# Patient Record
Sex: Male | Born: 1965
Health system: Southern US, Community
[De-identification: ages and names within clinical notes are randomized; demographics above are authoritative.]

## PROBLEM LIST (undated history)

## (undated) DIAGNOSIS — E119 Type 2 diabetes mellitus without complications: Secondary | ICD-10-CM

---

## 2017-10-19 DIAGNOSIS — Z79899 Other long term (current) drug therapy: Secondary | ICD-10-CM | POA: Diagnosis not present

## 2017-10-19 DIAGNOSIS — E1129 Type 2 diabetes mellitus with other diabetic kidney complication: Secondary | ICD-10-CM | POA: Diagnosis not present

## 2017-10-19 DIAGNOSIS — R809 Proteinuria, unspecified: Secondary | ICD-10-CM | POA: Diagnosis not present

## 2017-10-30 DIAGNOSIS — J019 Acute sinusitis, unspecified: Secondary | ICD-10-CM | POA: Diagnosis not present

## 2018-09-24 DIAGNOSIS — E119 Type 2 diabetes mellitus without complications: Secondary | ICD-10-CM | POA: Diagnosis not present

## 2018-09-24 DIAGNOSIS — R7989 Other specified abnormal findings of blood chemistry: Secondary | ICD-10-CM | POA: Diagnosis not present

## 2018-09-24 DIAGNOSIS — Z125 Encounter for screening for malignant neoplasm of prostate: Secondary | ICD-10-CM | POA: Diagnosis not present

## 2018-09-24 DIAGNOSIS — M5126 Other intervertebral disc displacement, lumbar region: Secondary | ICD-10-CM | POA: Diagnosis not present

## 2018-10-02 DIAGNOSIS — Z0000 Encounter for general adult medical examination without abnormal findings: Secondary | ICD-10-CM | POA: Diagnosis not present

## 2018-10-31 ENCOUNTER — Ambulatory Visit (HOSPITAL_COMMUNITY)
Admission: EM | Admit: 2018-10-31 | Discharge: 2018-10-31 | Disposition: A | Discharge status: Home / Self Care | Payer: BLUE CROSS/BLUE SHIELD | Attending: Family Medicine | Admitting: Family Medicine

## 2018-10-31 ENCOUNTER — Encounter (HOSPITAL_COMMUNITY): Payer: Self-pay | Admitting: Emergency Medicine

## 2018-10-31 ENCOUNTER — Other Ambulatory Visit: Payer: Self-pay

## 2018-10-31 DIAGNOSIS — S39012A Strain of muscle, fascia and tendon of lower back, initial encounter: Secondary | ICD-10-CM | POA: Diagnosis not present

## 2018-10-31 HISTORY — DX: Type 2 diabetes mellitus without complications: E11.9

## 2018-10-31 MED ORDER — KETOROLAC TROMETHAMINE 60 MG/2ML IM SOLN
60.0000 mg | Freq: Once | INTRAMUSCULAR | Status: AC
  Administered 2018-10-31: 60 mg via INTRAMUSCULAR

## 2018-10-31 MED ORDER — KETOROLAC TROMETHAMINE 60 MG/2ML IM SOLN
INTRAMUSCULAR | Status: AC
  Filled 2018-10-31: qty 2

## 2018-10-31 MED ORDER — METHOCARBAMOL 750 MG PO TABS: 750.0000 mg | ORAL_TABLET | Freq: Three times a day (TID) | ORAL | 0 refills | Status: DC

## 2018-10-31 MED ORDER — IBUPROFEN 600 MG PO TABS: 600.0000 mg | ORAL_TABLET | Freq: Four times a day (QID) | ORAL | 0 refills | Status: DC | PRN

## 2018-10-31 NOTE — Discharge Instructions (Addendum)
We gave you a shot of Toradol today Use anti-inflammatories for pain/swelling. You may take up to 800 mg Ibuprofen every 8 hours with food. You may supplement Ibuprofen with Tylenol [phone] mg every 8 hours.  You may use Robaxin-muscle relaxer- 3 times daily, this may cause drowsiness, please limit use to at home or bedtime Alternate ice and heat Avoid heavy lifting, avoid complete bedrest-please go about daily activities to work tension out of muscles  Follow-up if pain worsening, developing numbness or tingling, weakness in legs, changes in urination or bowel movements, persistent symptoms.

## 2018-10-31 NOTE — ED Triage Notes (Signed)
Patient has back issues, "bulging disc since my 20's".  Last night, patient got out of his car and pain hit him.  Pain is lower left

## 2018-10-31 NOTE — ED Provider Notes (Signed)
[facility]    CSN: [id number] Arrival date & time: 10/31/18  1914     History   Chief Complaint Chief Complaint  Patient presents with  . Back Pain    HPI John Decker is a 52 y.o. male history of DM type II presenting today for evaluation of left lower back pain.  Patient states that after work yesterday, he was getting out of his car and felt a pull in his left lower back.  Since he has had persistent pain, pain worsening with movements.  Patient states that he was diagnosed with a small herniated disc approximately 30 years ago, but has not had persistent issues with this.  He went to physical therapy and a chiropractor and learned how to stretch and help with this pain.  He has never had pain in his back like this.  Denies any saddle anesthesia, numbness or tingling.  Denies changes in urination or bowel habits.  Denies weakness in legs.  He tried taking ibuprofen yesterday but symptoms persisting today.  Denies urinary symptoms of dysuria or increased frequency.  HPI  Past Medical History:  Diagnosis Date  . Diabetes mellitus without complication (HCC)     There are no active problems to display for this patient.   History reviewed. No pertinent surgical history.     Home Medications    Prior to Admission medications   Medication Sig Start Date End Date Taking? Authorizing Provider  losartan (COZAAR) 25 MG tablet Take 25 mg by mouth daily.   Yes [provider]  metFORMIN (GLUCOPHAGE) 500 MG tablet Take by mouth 2 (two) times daily with a meal.   Yes [provider]  ibuprofen (ADVIL,MOTRIN) 600 MG tablet Take 1 tablet (600 mg total) by mouth every 6 (six) hours as needed. 10/31/18   Silas Sedam C, PA-C  methocarbamol (ROBAXIN) 750 MG tablet Take 1 tablet (750 mg total) by mouth 3 (three) times daily. 10/31/18   Nikkole Placzek, Junius Creamer, PA-C    Family History History reviewed. No pertinent family history.  Social History Social  History   Tobacco Use  . Smoking status: Never Smoker  Substance Use Topics  . Alcohol use: Never    Frequency: Never  . Drug use: Never     Allergies   Patient has no known allergies.   Review of Systems Review of Systems  Constitutional: Negative for fatigue and fever.  HENT: Negative for congestion, sinus pressure and sore throat.   Eyes: Negative for photophobia, pain and visual disturbance.  Respiratory: Negative for cough and shortness of breath.   Cardiovascular: Negative for chest pain.  Gastrointestinal: Negative for abdominal pain, nausea and vomiting.  Genitourinary: Negative for decreased urine volume, difficulty urinating and hematuria.  Musculoskeletal: Positive for back pain, gait problem and myalgias. Negative for neck pain and neck stiffness.  Skin: Negative for color change.  Neurological: Negative for dizziness, syncope, facial asymmetry, speech difficulty, weakness, light-headedness, numbness and headaches.     Physical Exam Triage Vital Signs ED Triage Vitals [10/31/18 0823]  Enc Vitals Group     BP      Pulse      Resp      Temp      Temp src      SpO2      Weight      Height      Head Circumference      Peak Flow      Pain Score 8  Pain Loc      Pain Edu?      Excl. in GC?    No data found.  Updated Vital Signs BP 129/65 (BP Location: Left Arm)   Pulse 71   Temp 97.7 F (36.5 C) (Oral)   Resp 18   SpO2 100%   Visual Acuity Right Eye Distance:   Left Eye Distance:   Bilateral Distance:    Right Eye Near:   Left Eye Near:    Bilateral Near:     Physical Exam  Constitutional: He appears well-developed and well-nourished.  HENT:  Head: Normocephalic and atraumatic.  Eyes: Conjunctivae are normal.  Neck: Neck supple.  Cardiovascular: Normal rate and regular rhythm.  No murmur heard. Pulmonary/Chest: Effort normal and breath sounds normal. No respiratory distress.  Abdominal: Soft. There is no tenderness.    Musculoskeletal: He exhibits no edema.  Nontender to palpation of cervical and thoracic spine midline, mild tenderness to palpation of lumbar spine midline throughout upper lumbar area, significant increased tenderness to palpation of left paraspinal lumbar musculature extending into more lateral lumbar area tenderness does not extend into gluteal area, negative straight leg raise bilaterally  Strength 5/5 and equal bilaterally at hips and knees, patellar reflex 2+  Neurological: He is alert.  Skin: Skin is warm and dry.  Psychiatric: He has a normal mood and affect.  Nursing note and vitals reviewed.    UC Treatments / Results  Labs (all labs ordered are listed, but only abnormal results are displayed) Labs Reviewed - No data to display  EKG None  Radiology No results found.  Procedures Procedures (including critical care time)  Medications Ordered in UC Medications  ketorolac (TORADOL) injection 60 mg (60 mg Intramuscular Given 10/31/18 0910)    Initial Impression / Assessment and Plan / UC Course  I have reviewed the triage vital signs and the nursing notes.  Pertinent labs & imaging results that were available during my care of the patient were reviewed by me and considered in my medical decision making (see chart for details).   No red flags for cauda equina Likely lumbar strain versus lumbar radiculopathy, will treat with anti-inflammatories and muscle relaxers.  Discussed prednisone versus NSAIDs.  Opted for NSAIDs.  Also provide Robaxin in order to minimize sedation.  Ice and heat.  Activity modification, but avoid bedrest.  Toradol prior to discharge.Discussed strict return precautions. Patient verbalized understanding and is agreeable with plan.   Final Clinical Impressions(s) / UC Diagnoses   Final diagnoses:  Strain of lumbar region, initial encounter     Discharge Instructions     We gave you a shot of Toradol today Use anti-inflammatories for  pain/swelling. You may take up to 800 mg Ibuprofen every 8 hours with food. You may supplement Ibuprofen with Tylenol [phone] mg every 8 hours.  You may use Robaxin-muscle relaxer- 3 times daily, this may cause drowsiness, please limit use to at home or bedtime Alternate ice and heat Avoid heavy lifting, avoid complete bedrest-please go about daily activities to work tension out of muscles  Follow-up if pain worsening, developing numbness or tingling, weakness in legs, changes in urination or bowel movements, persistent symptoms.    ED Prescriptions    Medication Sig Dispense Auth. Provider   ibuprofen (ADVIL,MOTRIN) 600 MG tablet Take 1 tablet (600 mg total) by mouth every 6 (six) hours as needed. 30 tablet Quentez Lober C, PA-C   methocarbamol (ROBAXIN) 750 MG tablet Take 1 tablet (750 mg total) by  mouth 3 (three) times daily. 24 tablet Kaz Auld, [place] C, PA-C     Controlled Substance Prescriptions [place] Controlled Substance Registry consulted? Not Applicable   Lew Dawes, [place] 10/31/18 1610

## 2019-07-29 DIAGNOSIS — E119 Type 2 diabetes mellitus without complications: Secondary | ICD-10-CM | POA: Diagnosis not present

## 2019-07-29 DIAGNOSIS — R7989 Other specified abnormal findings of blood chemistry: Secondary | ICD-10-CM | POA: Diagnosis not present

## 2019-08-05 DIAGNOSIS — Z7189 Other specified counseling: Secondary | ICD-10-CM | POA: Diagnosis not present

## 2019-08-05 DIAGNOSIS — E119 Type 2 diabetes mellitus without complications: Secondary | ICD-10-CM | POA: Diagnosis not present

## 2019-08-05 DIAGNOSIS — Z0000 Encounter for general adult medical examination without abnormal findings: Secondary | ICD-10-CM | POA: Diagnosis not present

## 2019-08-05 DIAGNOSIS — R7989 Other specified abnormal findings of blood chemistry: Secondary | ICD-10-CM | POA: Diagnosis not present

## 2019-10-13 ENCOUNTER — Ambulatory Visit (HOSPITAL_COMMUNITY)
Admission: EM | Admit: 2019-10-13 | Discharge: 2019-10-13 | Disposition: A | Discharge status: Home / Self Care | Payer: BC Managed Care – PPO | Attending: Urgent Care | Admitting: Urgent Care

## 2019-10-13 ENCOUNTER — Other Ambulatory Visit: Payer: Self-pay

## 2019-10-13 ENCOUNTER — Telehealth: Payer: BLUE CROSS/BLUE SHIELD | Admitting: Family

## 2019-10-13 ENCOUNTER — Encounter (HOSPITAL_COMMUNITY): Payer: Self-pay

## 2019-10-13 DIAGNOSIS — Z79899 Other long term (current) drug therapy: Secondary | ICD-10-CM | POA: Insufficient documentation

## 2019-10-13 DIAGNOSIS — Z7984 Long term (current) use of oral hypoglycemic drugs: Secondary | ICD-10-CM | POA: Insufficient documentation

## 2019-10-13 DIAGNOSIS — E119 Type 2 diabetes mellitus without complications: Secondary | ICD-10-CM | POA: Insufficient documentation

## 2019-10-13 DIAGNOSIS — H938X3 Other specified disorders of ear, bilateral: Secondary | ICD-10-CM

## 2019-10-13 DIAGNOSIS — Z7982 Long term (current) use of aspirin: Secondary | ICD-10-CM | POA: Insufficient documentation

## 2019-10-13 DIAGNOSIS — J069 Acute upper respiratory infection, unspecified: Secondary | ICD-10-CM

## 2019-10-13 DIAGNOSIS — R0981 Nasal congestion: Secondary | ICD-10-CM

## 2019-10-13 DIAGNOSIS — Z20828 Contact with and (suspected) exposure to other viral communicable diseases: Secondary | ICD-10-CM | POA: Diagnosis not present

## 2019-10-13 MED ORDER — PROMETHAZINE-DM 6.25-15 MG/5ML PO SYRP: 5.0000 mL | ORAL_SOLUTION | Freq: Every evening | ORAL | 0 refills | Status: DC | PRN

## 2019-10-13 MED ORDER — FLUTICASONE PROPIONATE 50 MCG/ACT NA SUSP: 2.0000 | Freq: Every day | NASAL | 6 refills | Status: DC

## 2019-10-13 MED ORDER — BENZONATATE 100 MG PO CAPS: 100.0000 mg | ORAL_CAPSULE | Freq: Three times a day (TID) | ORAL | 0 refills | Status: DC | PRN

## 2019-10-13 NOTE — Progress Notes (Signed)
We are sorry you are not feeling well.  Here is how we plan to help!  Based on what you have shared with me, it looks like you may have a viral upper respiratory infection.  Upper respiratory infections are caused by a large number of viruses; however, rhinovirus is the most common cause.   Symptoms vary from person to person, with common symptoms including sore throat, cough, fatigue or lack of energy and feeling of general discomfort.  A low-grade fever of up to 100.4 may present, but is often uncommon.  Symptoms vary however, and are closely related to a person's age or underlying illnesses.  The most common symptoms associated with an upper respiratory infection are nasal discharge or congestion, cough, sneezing, headache and pressure in the ears and face.  These symptoms usually persist for about 3 to 10 days, but can last up to 2 weeks.  It is important to know that upper respiratory infections do not cause serious illness or complications in most cases.    Upper respiratory infections can be transmitted from person to person, with the most common method of transmission being a person's hands.  The virus is able to live on the skin and can infect other persons for up to 2 hours after direct contact.  Also, these can be transmitted when someone coughs or sneezes; thus, it is important to cover the mouth to reduce this risk.  To keep the spread of the illness at bay, good hand hygiene is very important.  This is an infection that is most likely caused by a virus. There are no specific treatments other than to help you with the symptoms until the infection runs its course.  We are sorry you are not feeling well.  Here is how we plan to help!   For nasal congestion, you may use an oral decongestants such as Mucinex D or if you have glaucoma or high blood pressure use plain Mucinex.  Saline nasal spray or nasal drops can help and can safely be used as often as needed for congestion.  For your congestion,  I have prescribed Fluticasone nasal spray one spray in each nostril twice a day  If you do not have a history of heart disease, hypertension, diabetes or thyroid disease, prostate/bladder issues or glaucoma, you may also use Sudafed to treat nasal congestion.  It is highly recommended that you consult with a pharmacist or your primary care physician to ensure this medication is safe for you to take.     Based on your symptoms, you also need to be COVID tested to rule this out. You can go to one of the testing sites listed below, while they are opened (see hours). You do not need an order and will stay in your car during the test. You do need to self isolate until your results return and if positive 10 days from when your symptoms started and until you are 3 days fever free.   Testing Locations (Monday - Friday, 8 a.m. - 3:30 p.m.) . [place]: [facility] at [facility], [address] [place], [place]  . [place]: [address], [place], [place] (entrance off [employer])  . [facility]: (Closed each Monday): Testing site relocated to the short stay covered drive at [facility]. (Use the [facility] entrance to [facility] next to [facility]   If you have a sore or scratchy throat, use a saltwater gargle-  to  teaspoon of salt dissolved in a 4-ounce to 8-ounce glass of warm water.  Gargle the solution for approximately 15-30 seconds and then spit.  It is important not to swallow the solution.  You can also use throat lozenges/cough drops and Chloraseptic spray to help with throat pain or discomfort.  Warm or cold liquids can also be helpful in relieving throat pain.  For headache, pain or general discomfort, you can use Ibuprofen or Tylenol as directed.   Some authorities believe that zinc sprays or the use of Echinacea may shorten the course of your symptoms.   HOME CARE . Only take medications as  instructed by your medical team. . Be sure to drink plenty of fluids. Water is fine as well as fruit juices, sodas and electrolyte beverages. You may want to stay away from caffeine or alcohol. If you are nauseated, try taking small sips of liquids. How do you know if you are getting enough fluid? Your urine should be a pale yellow or almost colorless. . Get rest. . Taking a steamy shower or using a humidifier may help nasal congestion and ease sore throat pain. You can place a towel over your head and breathe in the steam from hot water coming from a faucet. . Using a saline nasal spray works much the same way. . Cough drops, hard candies and sore throat lozenges may ease your cough. . Avoid close contacts especially the very young and the elderly . Cover your mouth if you cough or sneeze . Always remember to wash your hands.   GET HELP RIGHT AWAY IF: . You develop worsening fever. . If your symptoms do not improve within 10 days . You develop yellow or green discharge from your nose over 3 days. . You have coughing fits . You develop a severe head ache or visual changes. . You develop shortness of breath, difficulty breathing or start having chest pain . Your symptoms persist after you have completed your treatment plan  MAKE SURE YOU   Understand these instructions.  Will watch your condition.  Will get help right away if you are not doing well or get worse.  Your e-visit answers were reviewed by a board certified advanced clinical practitioner to complete your personal care plan. Depending upon the condition, your plan could have included both over the counter or prescription medications. Please review your pharmacy choice. If there is a problem, you may call our nursing hot line at and have the prescription routed to another pharmacy. Your safety is important to [place]. If you have drug allergies check your prescription carefully.   You can use MyChart to ask questions about today's  visit, request a non-urgent call back, or ask for a work or school excuse for 24 hours related to this e-Visit. If it has been greater than 24 hours you will need to follow up with your provider, or enter a new e-Visit to address those concerns. You will get an e-mail in the next two days asking about your experience.  I hope that your e-visit has been valuable and will speed your recovery. Thank you for using e-visits.     Approximately 5 minutes was spent documenting and reviewing patient's chart.

## 2019-10-13 NOTE — ED Provider Notes (Signed)
MRN: [id number] DOB: [birth year]  Subjective:   John Decker is a 53 y.o. male presenting for acute onset this morning of mild to moderate malaise.  ROS as below.  Has not tried medications for relief.  States that he does not want to take any medicines unless he absolutely has to.  Patient has DM II, states that he has good control, usually runs 90-110s fasting.   No current facility-administered medications for this encounter.   Current Outpatient Medications:  .  aspirin EC 81 MG tablet, Take 81 mg by mouth daily., Disp: , Rfl:  .  glipiZIDE (GLUCOTROL XL) 2.5 MG 24 hr tablet, Take 2.5 mg by mouth daily., Disp: , Rfl:  .  losartan (COZAAR) 25 MG tablet, Take 25 mg by mouth daily., Disp: , Rfl:  .  metFORMIN (GLUCOPHAGE) 500 MG tablet, Take by mouth 2 (two) times daily with a meal., Disp: , Rfl:    No Known Allergies  Past Medical History:  Diagnosis Date  . Diabetes mellitus without complication ([place])     History reviewed. No pertinent surgical history.  Review of Systems  Constitutional: Positive for malaise/fatigue. Negative for fever.  HENT: Positive for congestion and sore throat. Negative for ear pain and sinus pain.        Bilateral ear fullness.  Eyes: Negative for discharge and redness.  Respiratory: Negative for cough, hemoptysis, shortness of breath and wheezing.   Cardiovascular: Negative for chest pain.  Gastrointestinal: Negative for abdominal pain, diarrhea, nausea and vomiting.  Genitourinary: Negative for dysuria, flank pain and hematuria.  Musculoskeletal: Positive for myalgias.  Skin: Negative for rash.  Neurological: Positive for headaches. Negative for dizziness and weakness.  Psychiatric/Behavioral: Negative for depression and substance abuse.   Social History   Tobacco Use  . Smoking status: Never Smoker  . Smokeless tobacco: Never Used  Substance Use Topics  . Alcohol use: Never    Frequency: Never  . Drug use: Never   History reviewed. No  pertinent family history.  Objective:   Vitals: BP 130/80 (BP Location: Left Arm)   Pulse 72   Temp 99.7 F (37.6 C) (Oral)   Resp 16   SpO2 96%   Physical Exam Constitutional:      General: He is not in acute distress.    Appearance: Normal appearance. He is well-developed and normal weight. He is not ill-appearing, toxic-appearing or diaphoretic.  HENT:     Head: Normocephalic and atraumatic.     Right Ear: Tympanic membrane, ear canal and external ear normal. There is no impacted cerumen.     Left Ear: Tympanic membrane, ear canal and external ear normal. There is no impacted cerumen.     Nose: Nose normal. No congestion or rhinorrhea.     Mouth/Throat:     Mouth: Mucous membranes are moist.     Pharynx: Oropharynx is clear. No oropharyngeal exudate or posterior oropharyngeal erythema.  Eyes:     General: No scleral icterus.       Right eye: No discharge.        Left eye: No discharge.     Extraocular Movements: Extraocular movements intact.     Conjunctiva/sclera: Conjunctivae normal.     Pupils: Pupils are equal, round, and reactive to light.  Neck:     Musculoskeletal: Normal range of motion and neck supple. No neck rigidity or muscular tenderness.  Cardiovascular:     Rate and Rhythm: Normal rate and regular rhythm.     Heart sounds: Normal  heart sounds. No murmur. No friction rub. No gallop.   Pulmonary:     Effort: Pulmonary effort is normal. No respiratory distress.     Breath sounds: Normal breath sounds. No stridor. No wheezing, rhonchi or rales.  Neurological:     General: No focal deficit present.     Mental Status: He is alert and oriented to person, place, and time.  Psychiatric:        Mood and Affect: Mood normal.        Behavior: Behavior normal.        Thought Content: Thought content normal.    Assessment and Plan :   1. Viral URI   2. Sinus congestion   3. Ear fullness, bilateral   4. Type 2 diabetes mellitus without complication, without  long-term current use of insulin (HCC)     Will manage for viral illness. Counseled patient on nature of COVID-19 including modes of transmission, diagnostic testing, management and supportive care.  Offered symptomatic relief. COVID 19 testing is pending. Counseled patient on potential for adverse effects with medications prescribed/recommended today, ER and return-to-clinic precautions discussed, patient verbalized understanding.     Wallis Bamberg, PA-C 10/13/19 1221

## 2019-10-13 NOTE — Discharge Instructions (Addendum)
We will manage this as a viral syndrome. For sore throat or cough try using a honey-based tea. Use 3 teaspoons of honey with juice squeezed from half lemon. Place shaved pieces of ginger into 1/2-1 cup of water and warm over stove top. Then mix the ingredients and repeat every 4 hours as needed. Please take Tylenol 500mg  every 6 hours. Hydrate very well with at least 2 liters of water. Eat light meals such as soups to replenish electrolytes and soft fruits, veggies. Start an antihistamine like Zyrtec, Allegra or Claritin for postnasal drainage, sinus congestion.  You can take this together with pseudoephedrine (Sudafed) at a dose of 30-60 mg 3 times a day or twice daily as needed for the same kind of congestion.

## 2019-10-13 NOTE — ED Triage Notes (Signed)
Pt presents the UC for Covid test. Pt states he woke up this morning with headache, sore throat, nasal congestion, generalized body aches and with a fullness sensation his ears.

## 2019-10-14 LAB — SARS CORONAVIRUS 2 (TAT 6-24 HRS): SARS Coronavirus 2: NEGATIVE

## 2019-11-22 DIAGNOSIS — Z125 Encounter for screening for malignant neoplasm of prostate: Secondary | ICD-10-CM | POA: Diagnosis not present

## 2019-11-22 DIAGNOSIS — Z0000 Encounter for general adult medical examination without abnormal findings: Secondary | ICD-10-CM | POA: Diagnosis not present

## 2019-11-22 DIAGNOSIS — E119 Type 2 diabetes mellitus without complications: Secondary | ICD-10-CM | POA: Diagnosis not present

## 2019-12-02 DIAGNOSIS — Z0000 Encounter for general adult medical examination without abnormal findings: Secondary | ICD-10-CM | POA: Diagnosis not present

## 2019-12-02 DIAGNOSIS — E119 Type 2 diabetes mellitus without complications: Secondary | ICD-10-CM | POA: Diagnosis not present

## 2020-07-09 DIAGNOSIS — R945 Abnormal results of liver function studies: Secondary | ICD-10-CM | POA: Diagnosis not present

## 2020-07-09 DIAGNOSIS — E119 Type 2 diabetes mellitus without complications: Secondary | ICD-10-CM | POA: Diagnosis not present

## 2020-07-16 DIAGNOSIS — K760 Fatty (change of) liver, not elsewhere classified: Secondary | ICD-10-CM | POA: Diagnosis not present

## 2020-07-16 DIAGNOSIS — R945 Abnormal results of liver function studies: Secondary | ICD-10-CM | POA: Diagnosis not present

## 2020-07-16 DIAGNOSIS — Z7189 Other specified counseling: Secondary | ICD-10-CM | POA: Diagnosis not present

## 2020-07-16 DIAGNOSIS — E119 Type 2 diabetes mellitus without complications: Secondary | ICD-10-CM | POA: Diagnosis not present

## 2020-09-17 NOTE — Patient Instructions (Signed)
Thank you for choosing Primary Care at [facility] to be your medical home!    John Decker was seen by De Hollingshead, DO today.   John Decker's primary care provider is Marcy Siren, DO.   For the best care possible, you should try to see Marcy Siren, DO whenever you come to the clinic.   We look forward to seeing you again soon!  If you have any questions about your visit today, please call us at [phone] or feel free to reach your primary care provider via MyChart.

## 2020-09-18 ENCOUNTER — Other Ambulatory Visit: Payer: Self-pay

## 2020-09-18 ENCOUNTER — Encounter: Payer: Self-pay | Admitting: Internal Medicine

## 2020-09-18 ENCOUNTER — Ambulatory Visit (INDEPENDENT_AMBULATORY_CARE_PROVIDER_SITE_OTHER): Payer: BC Managed Care – PPO | Admitting: Internal Medicine

## 2020-09-18 VITALS — BP 157/86 | HR 69 | Temp 97.3°F | Resp 17 | Ht 67.0 in | Wt 221.0 lb

## 2020-09-18 DIAGNOSIS — Z13220 Encounter for screening for lipoid disorders: Secondary | ICD-10-CM

## 2020-09-18 DIAGNOSIS — Z7689 Persons encountering health services in other specified circumstances: Secondary | ICD-10-CM

## 2020-09-18 DIAGNOSIS — R030 Elevated blood-pressure reading, without diagnosis of hypertension: Secondary | ICD-10-CM

## 2020-09-18 DIAGNOSIS — E119 Type 2 diabetes mellitus without complications: Secondary | ICD-10-CM

## 2020-09-18 DIAGNOSIS — Z1211 Encounter for screening for malignant neoplasm of colon: Secondary | ICD-10-CM | POA: Diagnosis not present

## 2020-09-18 DIAGNOSIS — Z1159 Encounter for screening for other viral diseases: Secondary | ICD-10-CM

## 2020-09-18 DIAGNOSIS — I10 Essential (primary) hypertension: Secondary | ICD-10-CM | POA: Insufficient documentation

## 2020-09-18 LAB — POCT GLYCOSYLATED HEMOGLOBIN (HGB A1C): Hemoglobin A1C: 7.1 % — AB (ref 4.0–5.6)

## 2020-09-18 LAB — GLUCOSE, POCT (MANUAL RESULT ENTRY): POC Glucose: 175 mg/dl — AB (ref 70–99)

## 2020-09-18 MED ORDER — ONETOUCH DELICA LANCING DEV MISC: 0 refills | Status: DC

## 2020-09-18 MED ORDER — ONETOUCH DELICA LANCETS 33G MISC: 2 refills | Status: DC

## 2020-09-18 MED ORDER — ONETOUCH VERIO W/DEVICE KIT: PACK | 0 refills | Status: DC

## 2020-09-18 MED ORDER — ONETOUCH VERIO VI STRP: ORAL_STRIP | 2 refills | Status: DC

## 2020-09-18 NOTE — Progress Notes (Signed)
Subjective:    John Decker - 54 y.o. male MRN [id number]  Date of birth: August 29, 1966  HPI  John Decker is here to establish care. Was receiving care with another PCP.  PMH of T2DM.   Diabetes mellitus, Type 2 Disease Monitoring             Blood Sugar Ranges: Not checking as does not have the proper diabetic supplies. Reports he likes to check at least 3x per week.              Polyuria: no              Visual problems: no   Urine Microalbumin: collected today. On Arb.   Last A1C: Believes it was 7.0   Medications:  Metformin 500 mg BID, Glipizide XL 2.5 mg  Medication Compliance: yes  Medication Side Effects             Hypoglycemia: no    Health Maintenance:  Health Maintenance Due  Topic Date Due  . HEMOGLOBIN A1C  Never done  . Hepatitis C Screening  Never done  . PNEUMOCOCCAL POLYSACCHARIDE VACCINE AGE 28-64 HIGH RISK  Never done  . FOOT EXAM  Never done  . OPHTHALMOLOGY EXAM  Never done  . COVID-19 Vaccine (1) Never done  . HIV Screening  Never done  . TETANUS/TDAP  Never done  . COLONOSCOPY  Never done  . INFLUENZA VACCINE  Never done    -  reports that he has never smoked. He has never used smokeless tobacco. - Review of Systems: Per HPI. - Past Medical History: Patient Active Problem List   Diagnosis Date Noted  . Non-insulin dependent type 2 diabetes mellitus ([place]) 09/18/2020  . Essential hypertension 09/18/2020   - Medications: reviewed and updated   Objective:   Physical Exam Temp (!) 97.3 F (36.3 C) (Temporal)   Resp 17   Ht _0  (1.702 m)   Wt 221 lb (100.2 kg)   BMI 34.61 kg/m  Physical Exam Constitutional:      General: He is not in acute distress.    Appearance: He is not diaphoretic.  Cardiovascular:     Rate and Rhythm: Normal rate.  Pulmonary:     Effort: Pulmonary effort is normal. No respiratory distress.  Musculoskeletal:        General: Normal range of motion.  Skin:    General: Skin is warm and dry.  Neurological:      Mental Status: He is alert and oriented to person, place, and time.  Psychiatric:        Mood and Affect: Affect normal.        Judgment: Judgment normal.            Assessment & Plan:   1. Encounter to establish care Reviewed patient's PMH, social history, surgical history, and medications.   2. Non-insulin dependent type 2 diabetes mellitus (HCC) A1c 7.1, showing fairly optimal glycemic control. Continue current regimen. DM supplies ordered.  Counseled on Diabetic diet, my plate method, 709 minutes of moderate intensity exercise/week Blood sugar logs with fasting goals of 80-120 mg/dl, random of less than 180 and in the event of sugars less than 60 mg/dl or greater than 400 mg/dl encouraged to notify the clinic. Advised on the need for annual eye exams, annual foot exams, Pneumonia vaccine. - Glucose (CBG) - HgB A1c - Microalbumin/Creatinine Ratio, Urine - Lipid Panel - Comprehensive metabolic panel - Ambulatory referral to Ophthalmology - Blood Glucose Monitoring  Suppl (ONETOUCH VERIO) w/Device KIT; Use to check fasting FSBS every morning. Dx: E11.9  Dispense: 1 kit; Refill: 0 - glucose blood (ONETOUCH VERIO) test strip; Use to check fasting FSBS every morning. Dx: E11.9  Dispense: 100 each; Refill: 2 - Lancet Devices (ONE TOUCH DELICA LANCING DEV) MISC; Use to check fasting FSBS every morning. Dx: E11.9  Dispense: 1 each; Refill: 0 - OneTouch Delica Lancets 20T MISC; Use to check fasting FSBS every morning. Dx: E11.9  Dispense: 100 each; Refill: 2    3. Lipid screening - Lipid Panel  4. Colon cancer screening - Ambulatory referral to Gastroenterology  5. Need for hepatitis C screening test - HCV Ab w/Rflx to Verification  6. Elevated blood pressure reading without diagnosis of hypertension Patient reports he did not sleep at all last night. Monitors BP at home 3x per week and reports numbers always <130/80.   Phill Myron, D.O. 09/18/2020, 9:05 AM Primary Care  at [facility]

## 2020-09-19 LAB — LIPID PANEL
Chol/HDL Ratio: 4.8 ratio (ref 0.0–5.0)
Cholesterol, Total: 192 mg/dL (ref 100–199)
HDL: 40 mg/dL (ref >39)
LDL Chol Calc (NIH): 118 mg/dL — ABNORMAL HIGH (ref 0–99)
Triglycerides: 194 mg/dL — ABNORMAL HIGH (ref 0–149)
VLDL Cholesterol Cal: 34 mg/dL (ref 5–40)

## 2020-09-19 LAB — COMPREHENSIVE METABOLIC PANEL
ALT: 90 IU/L — ABNORMAL HIGH (ref 0–44)
AST: 58 IU/L — ABNORMAL HIGH (ref 0–40)
Albumin/Globulin Ratio: 1.8 (ref 1.2–2.2)
Albumin: 4.6 g/dL (ref 3.8–4.9)
Alkaline Phosphatase: 115 IU/L (ref 44–121)
BUN/Creatinine Ratio: 16 (ref 9–20)
BUN: 14 mg/dL (ref 6–24)
Bilirubin Total: 0.4 mg/dL (ref 0.0–1.2)
CO2: 22 mmol/L (ref 20–29)
Calcium: 9.5 mg/dL (ref 8.7–10.2)
Chloride: 100 mmol/L (ref 96–106)
Creatinine, Ser: 0.85 mg/dL (ref 0.76–1.27)
GFR calc Af Amer: 113 mL/min/{1.73_m2} (ref >59)
GFR calc non Af Amer: 97 mL/min/{1.73_m2} (ref >59)
Globulin, Total: 2.5 g/dL (ref 1.5–4.5)
Glucose: 157 mg/dL — ABNORMAL HIGH (ref 65–99)
Potassium: 4.4 mmol/L (ref 3.5–5.2)
Sodium: 136 mmol/L (ref 134–144)
Total Protein: 7.1 g/dL (ref 6.0–8.5)

## 2020-09-19 LAB — MICROALBUMIN / CREATININE URINE RATIO
Creatinine, Urine: 104.6 mg/dL
Microalb/Creat Ratio: 22 mg/g creat (ref 0–29)
Microalbumin, Urine: 23.1 ug/mL

## 2020-09-19 LAB — HCV INTERPRETATION

## 2020-09-19 LAB — HCV AB W/RFLX TO VERIFICATION: HCV Ab: <0.1 s/co ratio (ref 0.0–0.9)

## 2020-09-24 ENCOUNTER — Other Ambulatory Visit: Payer: Self-pay | Admitting: Internal Medicine

## 2020-09-24 MED ORDER — ATORVASTATIN CALCIUM 40 MG PO TABS: 40.0000 mg | ORAL_TABLET | Freq: Every day | ORAL | 3 refills | Status: AC

## 2020-09-30 NOTE — Progress Notes (Signed)
Patient notified of results & recommendations. Expressed understanding.

## 2020-10-07 ENCOUNTER — Other Ambulatory Visit: Payer: Self-pay

## 2020-10-07 DIAGNOSIS — E119 Type 2 diabetes mellitus without complications: Secondary | ICD-10-CM

## 2020-10-07 MED ORDER — GLIPIZIDE ER 2.5 MG PO TB24: 2.5000 mg | ORAL_TABLET | Freq: Every day | ORAL | 1 refills | Status: DC

## 2020-10-07 MED ORDER — ONETOUCH VERIO W/DEVICE KIT: PACK | 0 refills | Status: AC

## 2020-10-07 MED ORDER — METFORMIN HCL 500 MG PO TABS: 500.0000 mg | ORAL_TABLET | Freq: Two times a day (BID) | ORAL | 1 refills | Status: DC

## 2020-10-07 MED ORDER — ONETOUCH VERIO VI STRP: ORAL_STRIP | 2 refills | Status: AC

## 2020-10-07 MED ORDER — ONETOUCH DELICA LANCING DEV MISC: 0 refills | Status: AC

## 2020-10-07 MED ORDER — ONETOUCH DELICA LANCETS 33G MISC: 2 refills | Status: AC

## 2020-10-07 MED ORDER — LOSARTAN POTASSIUM 25 MG PO TABS: 25.0000 mg | ORAL_TABLET | Freq: Every day | ORAL | 1 refills | Status: DC

## 2020-11-16 ENCOUNTER — Encounter (INDEPENDENT_AMBULATORY_CARE_PROVIDER_SITE_OTHER): Payer: Self-pay | Admitting: Ophthalmology

## 2020-11-16 ENCOUNTER — Ambulatory Visit (INDEPENDENT_AMBULATORY_CARE_PROVIDER_SITE_OTHER): Payer: BC Managed Care – PPO | Admitting: Ophthalmology

## 2020-11-16 ENCOUNTER — Other Ambulatory Visit: Payer: Self-pay

## 2020-11-16 DIAGNOSIS — H2513 Age-related nuclear cataract, bilateral: Secondary | ICD-10-CM

## 2020-11-16 DIAGNOSIS — H43823 Vitreomacular adhesion, bilateral: Secondary | ICD-10-CM | POA: Diagnosis not present

## 2020-11-16 DIAGNOSIS — E119 Type 2 diabetes mellitus without complications: Secondary | ICD-10-CM

## 2020-11-16 NOTE — Assessment & Plan Note (Signed)
Minor condition, no vitreomacular changes observe

## 2020-11-16 NOTE — Assessment & Plan Note (Signed)
Minor color change of no consequence to clarity and no impact on acuity

## 2020-11-16 NOTE — Progress Notes (Signed)
11/16/2020     CHIEF COMPLAINT Patient presents for Diabetic Eye Exam (NP Diabetic EE, ref'd by Dr. Juleen China (PCP)///Pt reports stable vision, no F/F, no pain or pressure. )   HISTORY OF PRESENT ILLNESS: John Decker is a 54 y.o. male who presents to the clinic today for:   HPI    Diabetic Eye Exam    Vision is stable.  Diabetes characteristics include Type 2 and taking oral medications.  This started 5 years ago.  Blood sugar level is controlled.  Last Blood Glucose 105 (Taken last week, takes once a week).  Last A1C 7 (Taken 2 weeks ago). Additional comments: NP Diabetic EE, ref'd by Dr. Juleen China (PCP)   Pt reports stable vision, no F/F, no pain or pressure.        Last edited by Nichola Sizer D on 11/16/2020  3:49 PM. (History)      Referring physician: Nicolette Bang, [place],   [id number]  HISTORICAL INFORMATION:   Selected notes from the MEDICAL RECORD NUMBER    Lab Results  Component Value Date   HGBA1C 7.1 (A) 09/18/2020     CURRENT MEDICATIONS: No current outpatient medications on file. (Ophthalmic Drugs)   No current facility-administered medications for this visit. (Ophthalmic Drugs)   Current Outpatient Medications (Other)  Medication Sig  . aspirin EC 81 MG tablet Take 81 mg by mouth daily.  Marland Kitchen atorvastatin (LIPITOR) 40 MG tablet Take 1 tablet (40 mg total) by mouth daily.  . Blood Glucose Monitoring Suppl (ONETOUCH VERIO) w/Device KIT Use to check fasting FSBS every morning. Dx: E11.9  . glipiZIDE (GLUCOTROL XL) 2.5 MG 24 hr tablet Take 1 tablet (2.5 mg total) by mouth daily.  Marland Kitchen glucose blood (ONETOUCH VERIO) test strip Use to check fasting FSBS every morning. Dx: E11.9  . Lancet Devices (ONE TOUCH DELICA LANCING DEV) MISC Use to check fasting FSBS every morning. Dx: E11.9  . losartan (COZAAR) 25 MG tablet Take 1 tablet (25 mg total) by mouth daily.  . metFORMIN (GLUCOPHAGE) 500 MG tablet Take 1 tablet (500 mg  total) by mouth 2 (two) times daily with a meal.  . OneTouch Delica Lancets 60A MISC Use to check fasting FSBS every morning. Dx: E11.9   No current facility-administered medications for this visit. (Other)      REVIEW OF SYSTEMS:    ALLERGIES No Known Allergies  PAST MEDICAL HISTORY Past Medical History:  Diagnosis Date  . Diabetes mellitus without complication ([place])    History reviewed. No pertinent surgical history.  FAMILY HISTORY History reviewed. No pertinent family history.  SOCIAL HISTORY Social History   Tobacco Use  . Smoking status: Never Smoker  . Smokeless tobacco: Never Used  Substance Use Topics  . Alcohol use: Never  . Drug use: Never         OPHTHALMIC EXAM:  Base Eye Exam    Visual Acuity (ETDRS)      Right Left   Dist cc 20/20 20/20   Correction: Glasses       Tonometry (Tonopen, 3:54 PM)      Right Left   Pressure 23 16  2  attempts OD       Pupils      Pupils Dark Light Shape React APD   Right PERRL 5 4 Round Brisk None   Left PERRL 5 4 Round Brisk None       Visual Fields (Counting fingers)      Left Right  Full Full       Extraocular Movement      Right Left    Full Full       Neuro/Psych    Oriented x3: Yes       Dilation    Both eyes: 1.0% Mydriacyl, 2.5% Phenylephrine @ 3:54 PM        Slit Lamp and Fundus Exam    External Exam      Right Left   External Normal Normal       Slit Lamp Exam      Right Left   Lids/Lashes Normal Normal   Conjunctiva/Sclera White and quiet White and quiet   Cornea Clear Clear   Anterior Chamber Deep and quiet Deep and quiet   Iris Round and reactive Round and reactive   Lens 1+ Nuclear sclerosis 1+ Nuclear sclerosis   Anterior Vitreous Normal Normal       Fundus Exam      Right Left   Posterior Vitreous Normal Normal   Disc Normal Normal   C/D Ratio 0.35 0.35   Macula Normal Normal   Vessels Normal Normal   Periphery Normal Normal          IMAGING AND  PROCEDURES  Imaging and Procedures for 11/16/20  OCT, Retina - OU - Both Eyes       Right Eye Quality was good. Scan locations included subfoveal. Central Foveal Thickness: 313. Progression has no prior data. Findings include normal foveal contour.   Left Eye Quality was good. Scan locations included subfoveal. Central Foveal Thickness: 303. Progression has no prior data. Findings include normal foveal contour.   Notes No detectable diabetic retinopathy OU                ASSESSMENT/PLAN:  Vitreomacular adhesion of both eyes Minor condition, no vitreomacular changes observe  Nuclear sclerotic cataract of both eyes Minor color change of no consequence to clarity and no impact on acuity      ICD-10-CM   1. Diabetes mellitus without complication (HCC)  T70.0   2. Vitreomacular adhesion of both eyes  H43.823 OCT, Retina - OU - Both Eyes  3. Non-insulin dependent type 2 diabetes mellitus ([place])  E11.9   4. Nuclear sclerotic cataract of both eyes  H25.13     1.  2.  3.  Ophthalmic Meds Ordered this visit:  No orders of the defined types were placed in this encounter.      Return in about 1 year (around 11/16/2021) for DILATE OU, COLOR FP.  There are no Patient Instructions on file for this visit.   Explained the diagnoses, plan, and follow up with the patient and they expressed understanding.  Patient expressed understanding of the importance of proper follow up care.   Clent Demark Sarea Fyfe M.D. Diseases & Surgery of the Retina and Vitreous [facility] [place] 11/16/20     Abbreviations: M myopia (nearsighted); A astigmatism; H hyperopia (farsighted); P presbyopia; Mrx spectacle prescription;  CTL contact lenses; OD right eye; OS left eye; OU both eyes  XT exotropia; ET esotropia; PEK punctate epithelial keratitis; PEE punctate epithelial erosions; DES dry eye syndrome; MGD meibomian gland dysfunction; ATs artificial tears; PFAT's preservative free  artificial tears; [place] nuclear sclerotic cataract; PSC posterior subcapsular cataract; ERM epi-retinal membrane; PVD posterior vitreous detachment; RD retinal detachment; DM diabetes mellitus; DR diabetic retinopathy; NPDR non-proliferative diabetic retinopathy; PDR proliferative diabetic retinopathy; CSME clinically significant macular edema; DME diabetic macular edema; dbh dot blot hemorrhages;  CWS cotton wool spot; POAG primary open angle glaucoma; C/D cup-to-disc ratio; HVF humphrey visual field; GVF goldmann visual field; OCT optical coherence tomography; IOP intraocular pressure; BRVO Branch retinal vein occlusion; CRVO central retinal vein occlusion; CRAO central retinal artery occlusion; BRAO branch retinal artery occlusion; RT retinal tear; SB scleral buckle; PPV pars plana vitrectomy; VH Vitreous hemorrhage; PRP panretinal laser photocoagulation; IVK intravitreal kenalog; VMT vitreomacular traction; MH Macular hole;  NVD neovascularization of the disc; NVE neovascularization elsewhere; AREDS age related eye disease study; ARMD age related macular degeneration; POAG primary open angle glaucoma; EBMD epithelial/anterior basement membrane dystrophy; ACIOL anterior chamber intraocular lens; IOL intraocular lens; PCIOL posterior chamber intraocular lens; Phaco/IOL phacoemulsification with intraocular lens placement; [place] photorefractive keratectomy; LASIK laser assisted in situ keratomileusis; HTN hypertension; DM diabetes mellitus; COPD chronic obstructive pulmonary disease

## 2021-03-25 ENCOUNTER — Ambulatory Visit (INDEPENDENT_AMBULATORY_CARE_PROVIDER_SITE_OTHER): Payer: BC Managed Care – PPO | Admitting: Nurse Practitioner

## 2021-03-25 ENCOUNTER — Ambulatory Visit: Payer: BC Managed Care – PPO | Admitting: Internal Medicine

## 2021-03-25 VITALS — BP 134/80 | HR 62 | Temp 97.9°F | Resp 18 | Wt 224.0 lb

## 2021-03-25 DIAGNOSIS — E119 Type 2 diabetes mellitus without complications: Secondary | ICD-10-CM | POA: Diagnosis not present

## 2021-03-25 MED ORDER — LOSARTAN POTASSIUM 25 MG PO TABS: 25.0000 mg | ORAL_TABLET | Freq: Every day | ORAL | 1 refills | Status: AC

## 2021-03-25 MED ORDER — GLIPIZIDE ER 2.5 MG PO TB24: 2.5000 mg | ORAL_TABLET | Freq: Every day | ORAL | 1 refills | Status: AC

## 2021-03-25 MED ORDER — METFORMIN HCL 500 MG PO TABS: 500.0000 mg | ORAL_TABLET | Freq: Two times a day (BID) | ORAL | 1 refills | Status: DC

## 2021-03-25 NOTE — Patient Instructions (Addendum)
Non-insulin dependent type 2 diabetes mellitus (HCC) A1c 7.1, showing fairly optimal glycemic control. Continue current regimen. DM supplies ordered.  Counseled on Diabetic diet, my plate method, 638 minutes of moderate intensity exercise/week Blood sugar logs with fasting goals of 80-120 mg/dl, random of less than 180 and in the event of sugars less than 60 mg/dl or greater than 400 mg/dl encouraged to notify the clinic. Advised on the need for annual eye exams, annual foot exams - HgB A1c - Microalbumin/Creatinine Ratio, Urine - Comprehensive metabolic panel  - Blood Glucose Monitoring Suppl (ONETOUCH VERIO) w/Device KIT; Use to check fasting FSBS every morning. Dx: E11.9  Dispense: 1 kit; Refill: 0 - glucose blood (ONETOUCH VERIO) test strip; Use to check fasting FSBS every morning. Dx: E11.9  Dispense: 100 each; Refill: 2 - Lancet Devices (ONE TOUCH DELICA LANCING DEV) MISC; Use to check fasting FSBS every morning. Dx: E11.9  Dispense: 1 each; Refill: 0 - OneTouch Delica Lancets 75I MISC; Use to check fasting FSBS every morning. Dx: E11.9  Dispense: 100 each; Refill: 2  Elevated blood pressure reading without diagnosis of hypertension Continue current medications  Follow up Dr. Juleen China in 6 months or sooner if needed   Diabetes Mellitus and Nutrition, Adult When you have diabetes, or diabetes mellitus, it is very important to have healthy eating habits because your blood sugar (glucose) levels are greatly affected by what you eat and drink. Eating healthy foods in the right amounts, at about the same times every day, can help you:  Control your blood glucose.  Lower your risk of heart disease.  Improve your blood pressure.  Reach or maintain a healthy weight. What can affect my meal plan? Every person with diabetes is different, and each person has different needs for a meal plan. Your health care provider may recommend that you work with a dietitian to make a meal plan that is  best for you. Your meal plan may vary depending on factors such as:  The calories you need.  The medicines you take.  Your weight.  Your blood glucose, blood pressure, and cholesterol levels.  Your activity level.  Other health conditions you have, such as heart or kidney disease. How do carbohydrates affect me? Carbohydrates, also called carbs, affect your blood glucose level more than any other type of food. Eating carbs naturally raises the amount of glucose in your blood. Carb counting is a method for keeping track of how many carbs you eat. Counting carbs is important to keep your blood glucose at a healthy level, especially if you use insulin or take certain oral diabetes medicines. It is important to know how many carbs you can safely have in each meal. This is different for every person. Your dietitian can help you calculate how many carbs you should have at each meal and for each snack. How does alcohol affect me? Alcohol can cause a sudden decrease in blood glucose (hypoglycemia), especially if you use insulin or take certain oral diabetes medicines. Hypoglycemia can be a life-threatening condition. Symptoms of hypoglycemia, such as sleepiness, dizziness, and confusion, are similar to symptoms of having too much alcohol.  Do not drink alcohol if: ? Your health care provider tells you not to drink. ? You are pregnant, may be pregnant, or are planning to become pregnant.  If you drink alcohol: ? Do not drink on an empty stomach. ? Limit how much you use to:  0-1 drink a day for women.  0-2 drinks a day for  men. ? Be aware of how much alcohol is in your drink. In the [place] one drink equals one 12 oz bottle of beer (355 mL), one 5 oz glass of wine (148 mL), or one 1 oz glass of hard liquor (44 mL). ? Keep yourself hydrated with water, diet soda, or unsweetened iced tea.  Keep in mind that regular soda, juice, and other mixers may contain a lot of sugar and must be counted as  carbs. What are tips for following this plan? Reading food labels  Start by checking the serving size on the "Nutrition Facts" label of packaged foods and drinks. The amount of calories, carbs, fats, and other nutrients listed on the label is based on one serving of the item. Many items contain more than one serving per package.  Check the total grams (g) of carbs in one serving. You can calculate the number of servings of carbs in one serving by dividing the total carbs by 15. For example, if a food has 30 g of total carbs per serving, it would be equal to 2 servings of carbs.  Check the number of grams (g) of saturated fats and trans fats in one serving. Choose foods that have a low amount or none of these fats.  Check the number of milligrams (mg) of salt (sodium) in one serving. Most people should limit total sodium intake to less than 2,300 mg per day.  Always check the nutrition information of foods labeled as "low-fat" or "nonfat." These foods may be higher in added sugar or refined carbs and should be avoided.  Talk to your dietitian to identify your daily goals for nutrients listed on the label. Shopping  Avoid buying canned, pre-made, or processed foods. These foods tend to be high in fat, sodium, and added sugar.  Shop around the outside edge of the grocery store. This is where you will most often find fresh fruits and vegetables, bulk grains, fresh meats, and fresh dairy. Cooking  Use low-heat cooking methods, such as baking, instead of high-heat cooking methods like deep frying.  Cook using healthy oils, such as olive, canola, or sunflower oil.  Avoid cooking with butter, cream, or high-fat meats. Meal planning  Eat meals and snacks regularly, preferably at the same times every day. Avoid going long periods of time without eating.  Eat foods that are high in fiber, such as fresh fruits, vegetables, beans, and whole grains. Talk with your dietitian about how many servings  of carbs you can eat at each meal.  Eat 4-6 oz (112-168 g) of lean protein each day, such as lean meat, chicken, fish, eggs, or tofu. One ounce (oz) of lean protein is equal to: ? 1 oz (28 g) of meat, chicken, or fish. ? 1 egg. ?  cup (62 g) of tofu.  Eat some foods each day that contain healthy fats, such as avocado, nuts, seeds, and fish.   What foods should I eat? Fruits Berries. Apples. Oranges. Peaches. Apricots. Plums. Grapes. Mango. Papaya. Pomegranate. Kiwi. Cherries. Vegetables Lettuce. Spinach. Leafy greens, including kale, chard, collard greens, and mustard greens. Beets. Cauliflower. Cabbage. Broccoli. Carrots. Green beans. Tomatoes. Peppers. Onions. Cucumbers. Brussels sprouts. Grains Whole grains, such as whole-wheat or whole-grain bread, crackers, tortillas, cereal, and pasta. Unsweetened oatmeal. Quinoa. Brown or wild rice. Meats and other proteins Seafood. Poultry without skin. Lean cuts of poultry and beef. Tofu. Nuts. Seeds. Dairy Low-fat or fat-free dairy products such as milk, yogurt, and cheese. The items listed above may  not be a complete list of foods and beverages you can eat. Contact a dietitian for more information. What foods should I avoid? Fruits Fruits canned with syrup. Vegetables Canned vegetables. Frozen vegetables with butter or cream sauce. Grains Refined white flour and flour products such as bread, pasta, snack foods, and cereals. Avoid all processed foods. Meats and other proteins Fatty cuts of meat. Poultry with skin. Breaded or fried meats. Processed meat. Avoid saturated fats. Dairy Full-fat yogurt, cheese, or milk. Beverages Sweetened drinks, such as soda or iced tea. The items listed above may not be a complete list of foods and beverages you should avoid. Contact a dietitian for more information. Questions to ask a health care provider  Do I need to meet with a diabetes educator?  Do I need to meet with a dietitian?  What number  can I call if I have questions?  When are the best times to check my blood glucose? Where to find more information:  American Diabetes Association: diabetes.org  Academy of Nutrition and Dietetics: www.eatright.[employer] of Diabetes and Digestive and Kidney Diseases: [employer]  Association of Diabetes Care and Education Specialists: www.diabeteseducator.org Summary  It is important to have healthy eating habits because your blood sugar (glucose) levels are greatly affected by what you eat and drink.  A healthy meal plan will help you control your blood glucose and maintain a healthy lifestyle.  Your health care provider may recommend that you work with a dietitian to make a meal plan that is best for you.  Keep in mind that carbohydrates (carbs) and alcohol have immediate effects on your blood glucose levels. It is important to count carbs and to use alcohol carefully. This information is not intended to replace advice given to you by your health care provider. Make sure you discuss any questions you have with your health care provider. Document Revised: 10/29/2019 Document Reviewed: 10/29/2019 Elsevier Patient Education  2021 [employer].

## 2021-03-25 NOTE — Progress Notes (Signed)
_0  ID: John Decker, male    DOB: Jun 02, 1966, 55 y.o.   MRN: [id number]  Chief Complaint  Patient presents with  . Follow-up    Overall doing well - has gained weight - has been more sedentary     Referring provider: Caryl Never*   HPI  Patient presents today for a 57-monthfollow-up visit.  Patient was last seen by Dr. WJuleen China  Patient states that overall he is doing well.  Patient admits that his blood sugars have been slightly elevated recently.  He states that he has not been doing well with his diet and has been much more sedentary at work.  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  Next week he will return to his active work schedule.  Patient is compliant with diabetic medications and blood pressure meds.  He states that his blood pressure has been within normal limits when he checks it at home.  He does have Lipitor listed in his med list but states that he has never taken this medication.  Patient is up-to-date with diabetic eye exams.  Denies f/c/s, n/v/d, hemoptysis, PND, chest pain or edema.    No Known Allergies  Immunization History  Administered Date(s) Administered  . PFIZER(Purple Top)SARS-COV-2 Vaccination 04/28/2020, 06/06/2020  . Tdap 05/05/2017    Past Medical History:  Diagnosis Date  . Diabetes mellitus without complication ([place]     Tobacco History: Social History   Tobacco Use  Smoking Status Never Smoker  Smokeless Tobacco Never Used   Counseling given: Yes   Outpatient Encounter Medications as of 03/25/2021  Medication Sig  . aspirin EC 81 MG tablet Take 81 mg by mouth daily.  .Marland Kitchenatorvastatin (LIPITOR) 40 MG tablet Take 1 tablet (40 mg total) by mouth daily.  . Blood Glucose Monitoring Suppl (ONETOUCH VERIO) w/Device KIT Use to check fasting FSBS every morning. Dx: E11.9  . glipiZIDE (GLUCOTROL XL) 2.5 MG 24 hr tablet Take 1 tablet (2.5 mg total) by mouth daily.  .Marland Kitchenglucose blood (ONETOUCH VERIO) test strip Use  to check fasting FSBS every morning. Dx: E11.9  . Lancet Devices (ONE TOUCH DELICA LANCING DEV) MISC Use to check fasting FSBS every morning. Dx: E11.9  . losartan (COZAAR) 25 MG tablet Take 1 tablet (25 mg total) by mouth daily.  . metFORMIN (GLUCOPHAGE) 500 MG tablet Take 1 tablet (500 mg total) by mouth 2 (two) times daily with a meal.  . OneTouch Delica Lancets 341DMISC Use to check fasting FSBS every morning. Dx: E11.9  . [DISCONTINUED] fluticasone (FLONASE) 50 MCG/ACT nasal spray Place 2 sprays into both nostrils daily.  . [DISCONTINUED] glipiZIDE (GLUCOTROL XL) 2.5 MG 24 hr tablet Take 1 tablet (2.5 mg total) by mouth daily.  . [DISCONTINUED] losartan (COZAAR) 25 MG tablet Take 1 tablet (25 mg total) by mouth daily.  . [DISCONTINUED] metFORMIN (GLUCOPHAGE) 500 MG tablet Take 1 tablet (500 mg total) by mouth 2 (two) times daily with a meal.   No facility-administered encounter medications on file as of 03/25/2021.     Review of Systems  Review of Systems  Constitutional: Negative.  Negative for fatigue and fever.  HENT: Negative.   Respiratory: Negative for cough and shortness of breath.   Cardiovascular: Negative.  Negative for chest pain, palpitations and leg swelling.  Gastrointestinal: Negative.   Allergic/Immunologic: Negative.   Neurological: Negative.   Psychiatric/Behavioral: Negative.        Physical Exam  BP 134/80  Pulse 62   Temp 97.9 F (36.6 C)   Resp 18   Wt 224 lb (101.6 kg)   SpO2 99% Comment: RA  BMI 35.08 kg/m   Wt Readings from Last 5 Encounters:  03/25/21 224 lb (101.6 kg)  09/18/20 221 lb (100.2 kg)     Physical Exam Vitals and nursing note reviewed.  Constitutional:      General: He is not in acute distress.    Appearance: He is well-developed.  Cardiovascular:     Rate and Rhythm: Normal rate and regular rhythm.  Pulmonary:     Effort: Pulmonary effort is normal.     Breath sounds: Normal breath sounds.  Musculoskeletal:      Right lower leg: No edema.     Left lower leg: No edema.  Skin:    General: Skin is warm and dry.  Neurological:     Mental Status: He is alert and oriented to person, place, and time.  Psychiatric:        Mood and Affect: Mood normal.        Behavior: Behavior normal.      Lab Results:  CBC No results found for: WBC, RBC, HGB, HCT, PLT, MCV, MCH, MCHC, RDW, LYMPHSABS, MONOABS, EOSABS, BASOSABS  BMET    Component Value Date/Time   NA 136 09/18/2020 0935   K 4.4 09/18/2020 0935   CL 100 09/18/2020 0935   CO2 22 09/18/2020 0935   GLUCOSE 157 (H) 09/18/2020 0935   BUN 14 09/18/2020 0935   CREATININE 0.85 09/18/2020 0935   CALCIUM 9.5 09/18/2020 0935   GFRNONAA 97 09/18/2020 0935   GFRAA 113 09/18/2020 0935      Assessment & Plan:   Non-insulin dependent type 2 diabetes mellitus (HCC) Non-insulin dependent type 2 diabetes mellitus (HCC) A1c 7.1, showing fairly optimal glycemic control. Continue current regimen. DM supplies ordered.  Counseled on Diabetic diet, my plate method, 701 minutes of moderate intensity exercise/week Blood sugar logs with fasting goals of 80-120 mg/dl, random of less than 180 and in the event of sugars less than 60 mg/dl or greater than 400 mg/dl encouraged to notify the clinic. Advised on the need for annual eye exams, annual foot exams - HgB A1c - Microalbumin/Creatinine Ratio, Urine - Comprehensive metabolic panel  - Blood Glucose Monitoring Suppl (ONETOUCH VERIO) w/Device KIT; Use to check fasting FSBS every morning. Dx: E11.9  Dispense: 1 kit; Refill: 0 - glucose blood (ONETOUCH VERIO) test strip; Use to check fasting FSBS every morning. Dx: E11.9  Dispense: 100 each; Refill: 2 - Lancet Devices (ONE TOUCH DELICA LANCING DEV) MISC; Use to check fasting FSBS every morning. Dx: E11.9  Dispense: 1 each; Refill: 0 - OneTouch Delica Lancets 41C MISC; Use to check fasting FSBS every morning. Dx: E11.9  Dispense: 100 each; Refill: 2  Elevated  blood pressure reading without diagnosis of hypertension Continue current medications  Follow up Dr. Juleen China in 6 months or sooner if needed      Fenton Foy, NP 03/25/2021

## 2021-03-25 NOTE — Assessment & Plan Note (Signed)
Non-insulin dependent type 2 diabetes mellitus (HCC) A1c 7.1, showing fairly optimal glycemic control. Continue current regimen. DM supplies ordered.  Counseled on Diabetic diet, my plate method, 902 minutes of moderate intensity exercise/week Blood sugar logs with fasting goals of 80-120 mg/dl, random of less than 180 and in the event of sugars less than 60 mg/dl or greater than 400 mg/dl encouraged to notify the clinic. Advised on the need for annual eye exams, annual foot exams - HgB A1c - Microalbumin/Creatinine Ratio, Urine - Comprehensive metabolic panel  - Blood Glucose Monitoring Suppl (ONETOUCH VERIO) w/Device KIT; Use to check fasting FSBS every morning. Dx: E11.9  Dispense: 1 kit; Refill: 0 - glucose blood (ONETOUCH VERIO) test strip; Use to check fasting FSBS every morning. Dx: E11.9  Dispense: 100 each; Refill: 2 - Lancet Devices (ONE TOUCH DELICA LANCING DEV) MISC; Use to check fasting FSBS every morning. Dx: E11.9  Dispense: 1 each; Refill: 0 - OneTouch Delica Lancets 28O MISC; Use to check fasting FSBS every morning. Dx: E11.9  Dispense: 100 each; Refill: 2  Elevated blood pressure reading without diagnosis of hypertension Continue current medications  Follow up Dr. Juleen China in 6 months or sooner if needed

## 2021-03-31 ENCOUNTER — Other Ambulatory Visit: Payer: BC Managed Care – PPO

## 2021-04-02 ENCOUNTER — Telehealth: Payer: Self-pay | Admitting: Internal Medicine

## 2021-04-02 NOTE — Telephone Encounter (Signed)
Pt called stating he needs a refill on his metFORMIN (GLUCOPHAGE) 500 MG tablet. Pt states he should be taking it 2 times a day and his prescription was only written for him to take it 1 time a day. Pt stated that made him run out of pills too early. If a refill could be called in so he can take his correct dose at Pharmacy  [id number] [place], [place] - [address]  [address], [place] [address]  Phone:  [phone] Fax:  [phone]  DEA #:  [id number]

## 2021-04-05 ENCOUNTER — Other Ambulatory Visit: Payer: Self-pay | Admitting: Internal Medicine

## 2021-04-05 MED ORDER — METFORMIN HCL 500 MG PO TABS: 500.0000 mg | ORAL_TABLET | Freq: Two times a day (BID) | ORAL | 1 refills | Status: AC

## 2021-04-05 NOTE — Telephone Encounter (Signed)
Refill sent.   Marcy Siren, D.O. [facility] at [facility]  04/05/2021, 2:12 PM

## 2021-04-07 ENCOUNTER — Telehealth: Payer: Self-pay | Admitting: Internal Medicine

## 2021-04-07 ENCOUNTER — Other Ambulatory Visit: Payer: Self-pay

## 2021-04-07 ENCOUNTER — Other Ambulatory Visit: Payer: BC Managed Care – PPO

## 2021-04-07 DIAGNOSIS — E119 Type 2 diabetes mellitus without complications: Secondary | ICD-10-CM | POA: Diagnosis not present

## 2021-04-07 NOTE — Telephone Encounter (Signed)
Pt came in wanting to know if he is supposed to be taking only 1 metFORMIN (GLUCOPHAGE) 500 MG tablet a day. Pt stated he was never informed of this change. Pt would like a call please.

## 2021-04-08 LAB — COMPREHENSIVE METABOLIC PANEL
ALT: 86 IU/L — ABNORMAL HIGH (ref 0–44)
AST: 48 IU/L — ABNORMAL HIGH (ref 0–40)
Albumin/Globulin Ratio: 1.8 (ref 1.2–2.2)
Albumin: 4.5 g/dL (ref 3.8–4.9)
Alkaline Phosphatase: 98 IU/L (ref 44–121)
BUN/Creatinine Ratio: 22 — ABNORMAL HIGH (ref 9–20)
BUN: 18 mg/dL (ref 6–24)
Bilirubin Total: 0.4 mg/dL (ref 0.0–1.2)
CO2: 21 mmol/L (ref 20–29)
Calcium: 9.3 mg/dL (ref 8.7–10.2)
Chloride: 100 mmol/L (ref 96–106)
Creatinine, Ser: 0.83 mg/dL (ref 0.76–1.27)
Globulin, Total: 2.5 g/dL (ref 1.5–4.5)
Glucose: 113 mg/dL — ABNORMAL HIGH (ref 65–99)
Potassium: 4.4 mmol/L (ref 3.5–5.2)
Sodium: 138 mmol/L (ref 134–144)
Total Protein: 7 g/dL (ref 6.0–8.5)
eGFR: 103 mL/min/{1.73_m2} (ref >59)

## 2021-04-08 LAB — MICROALBUMIN / CREATININE URINE RATIO
Creatinine, Urine: 48.7 mg/dL
Microalb/Creat Ratio: 19 mg/g creat (ref 0–29)
Microalbumin, Urine: 9.2 ug/mL

## 2021-04-08 LAB — HEMOGLOBIN A1C
Est. average glucose Bld gHb Est-mCnc: 160 mg/dL
Hgb A1c MFr Bld: 7.2 % — ABNORMAL HIGH (ref 4.8–5.6)

## 2021-04-08 LAB — SPECIMEN STATUS REPORT

## 2021-04-08 NOTE — Telephone Encounter (Signed)
Returned pt call and made aware that his Metformin rx is take 1 tablet 2 times a day with a meal. Pt is wanting to know why was his rx changed he have always taking Metformin 1 tablet once a day. Made pt aware that I looked in his chart and all his Metformin rxs says 1 tablet 2 times daily. Pt hung up one. Didn't return call

## 2021-06-11 DIAGNOSIS — E669 Obesity, unspecified: Secondary | ICD-10-CM | POA: Diagnosis not present

## 2021-06-11 DIAGNOSIS — Z13220 Encounter for screening for lipoid disorders: Secondary | ICD-10-CM | POA: Diagnosis not present

## 2021-06-11 DIAGNOSIS — Z0184 Encounter for antibody response examination: Secondary | ICD-10-CM | POA: Diagnosis not present

## 2021-06-11 DIAGNOSIS — E119 Type 2 diabetes mellitus without complications: Secondary | ICD-10-CM | POA: Diagnosis not present

## 2021-06-11 DIAGNOSIS — Z0000 Encounter for general adult medical examination without abnormal findings: Secondary | ICD-10-CM | POA: Diagnosis not present

## 2021-06-18 ENCOUNTER — Other Ambulatory Visit: Payer: Self-pay | Admitting: Nurse Practitioner

## 2021-06-18 ENCOUNTER — Ambulatory Visit
Admission: RE | Admit: 2021-06-18 | Discharge: 2021-06-18 | Disposition: A | Discharge status: Home / Self Care | Payer: BC Managed Care – PPO | Source: Ambulatory Visit | Attending: Nurse Practitioner | Admitting: Nurse Practitioner

## 2021-06-18 DIAGNOSIS — W19XXXA Unspecified fall, initial encounter: Secondary | ICD-10-CM

## 2021-06-18 DIAGNOSIS — M25562 Pain in left knee: Secondary | ICD-10-CM | POA: Diagnosis not present

## 2021-06-28 ENCOUNTER — Other Ambulatory Visit: Payer: Self-pay | Admitting: Nurse Practitioner

## 2021-07-20 IMAGING — DX DG KNEE COMPLETE 4+V*L*
4 series · 4 of 4 positions shown · non-contrast
Comparison: None.

CLINICAL DATA: Pain since fall 3 weeks ago

EXAM:
LEFT KNEE - COMPLETE 4+ VIEW

[dg knee complete 4 views left (1 of 4)]
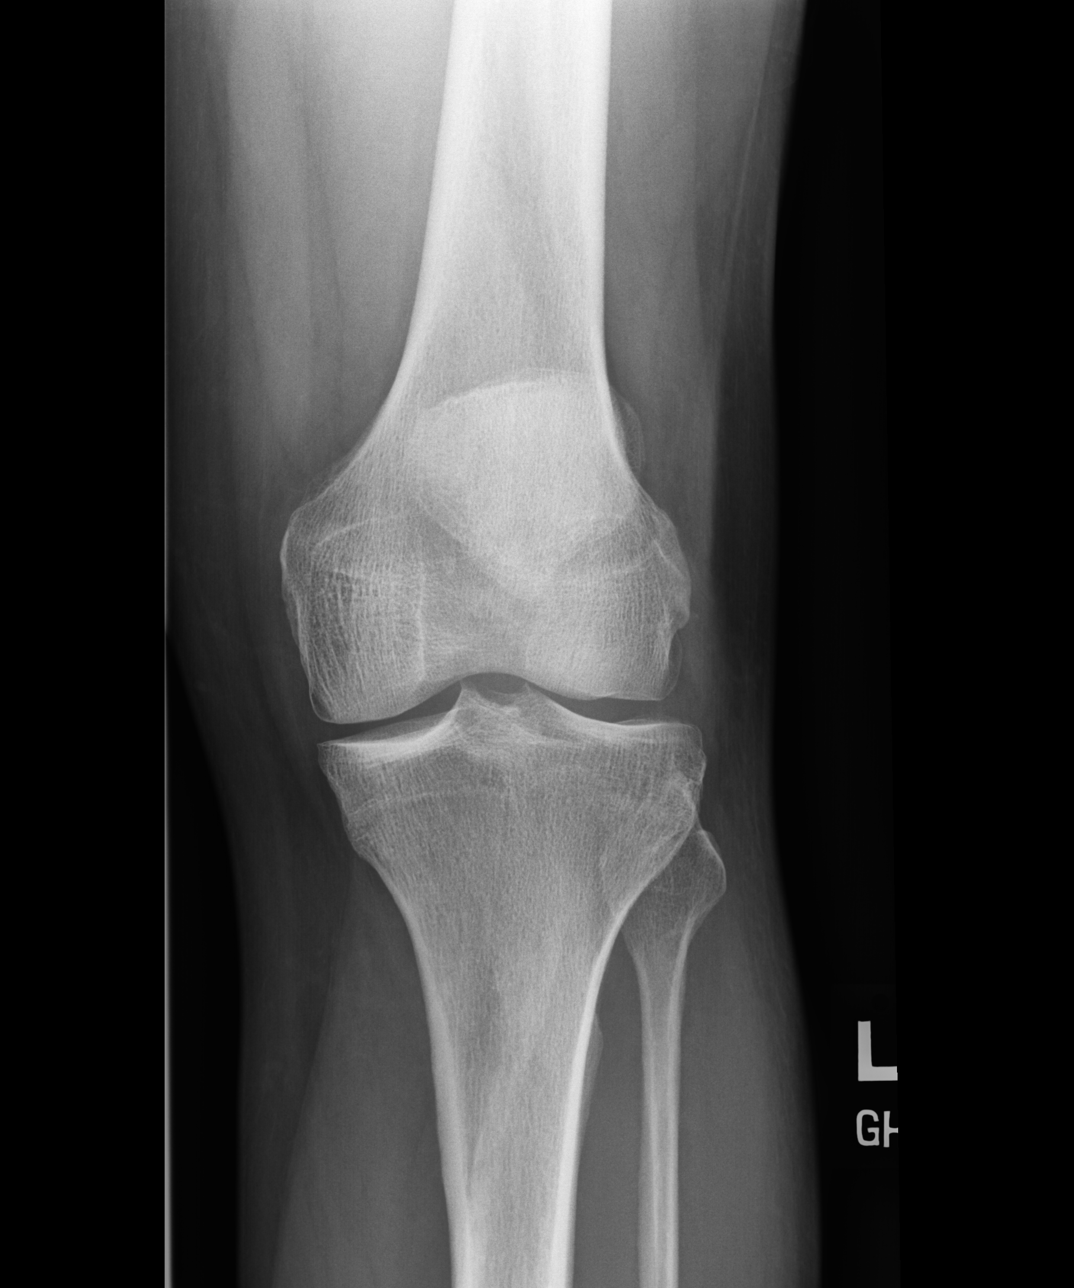

[dg knee complete 4 views left (2 of 4)]
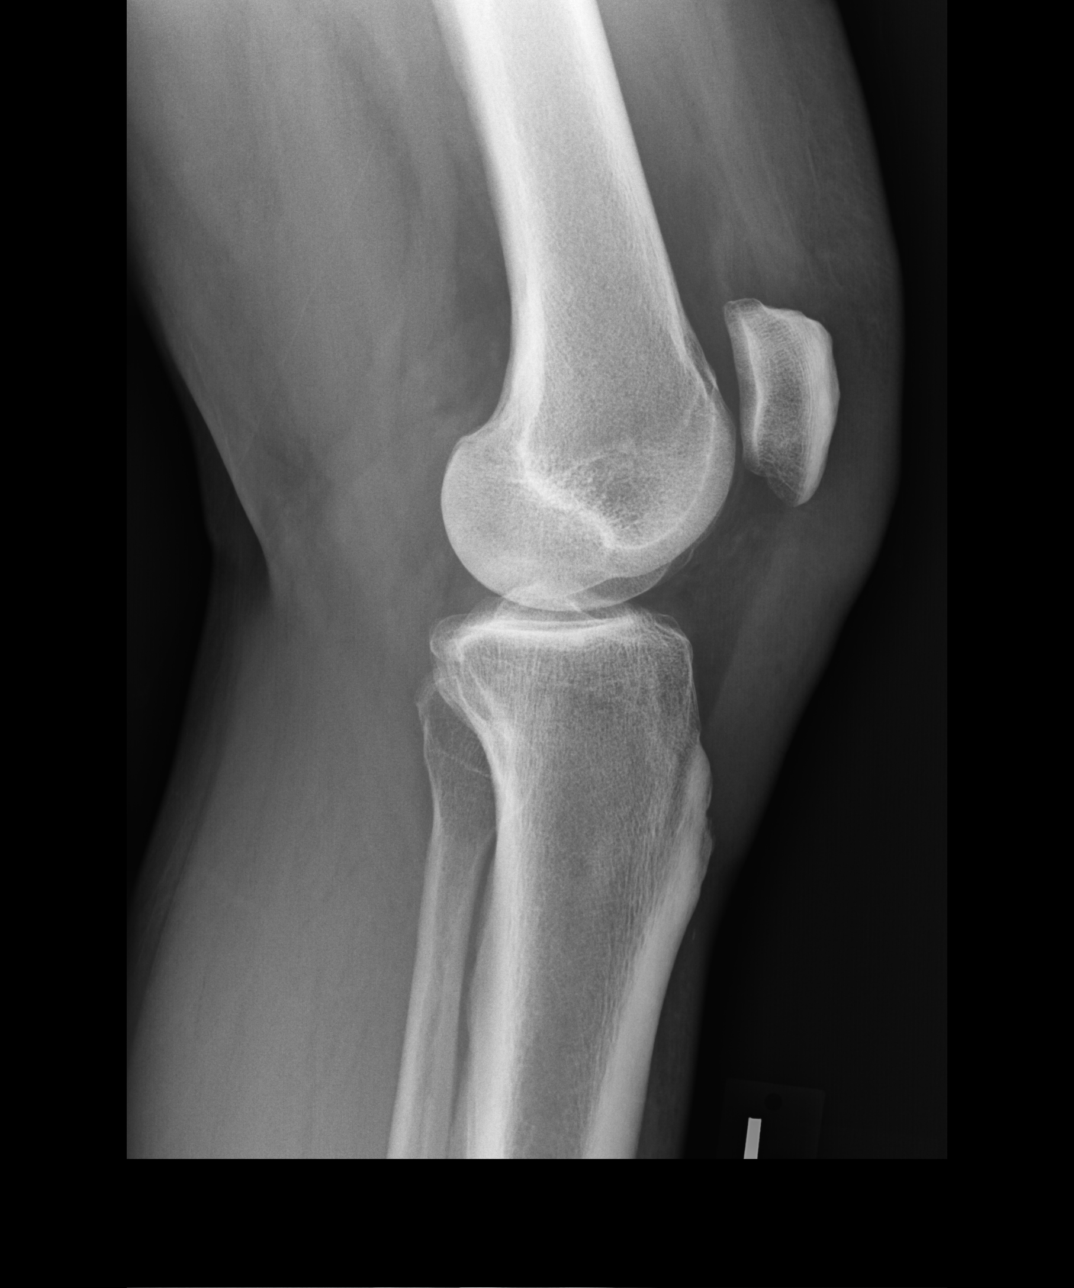

[dg knee complete 4 views left (3 of 4)]
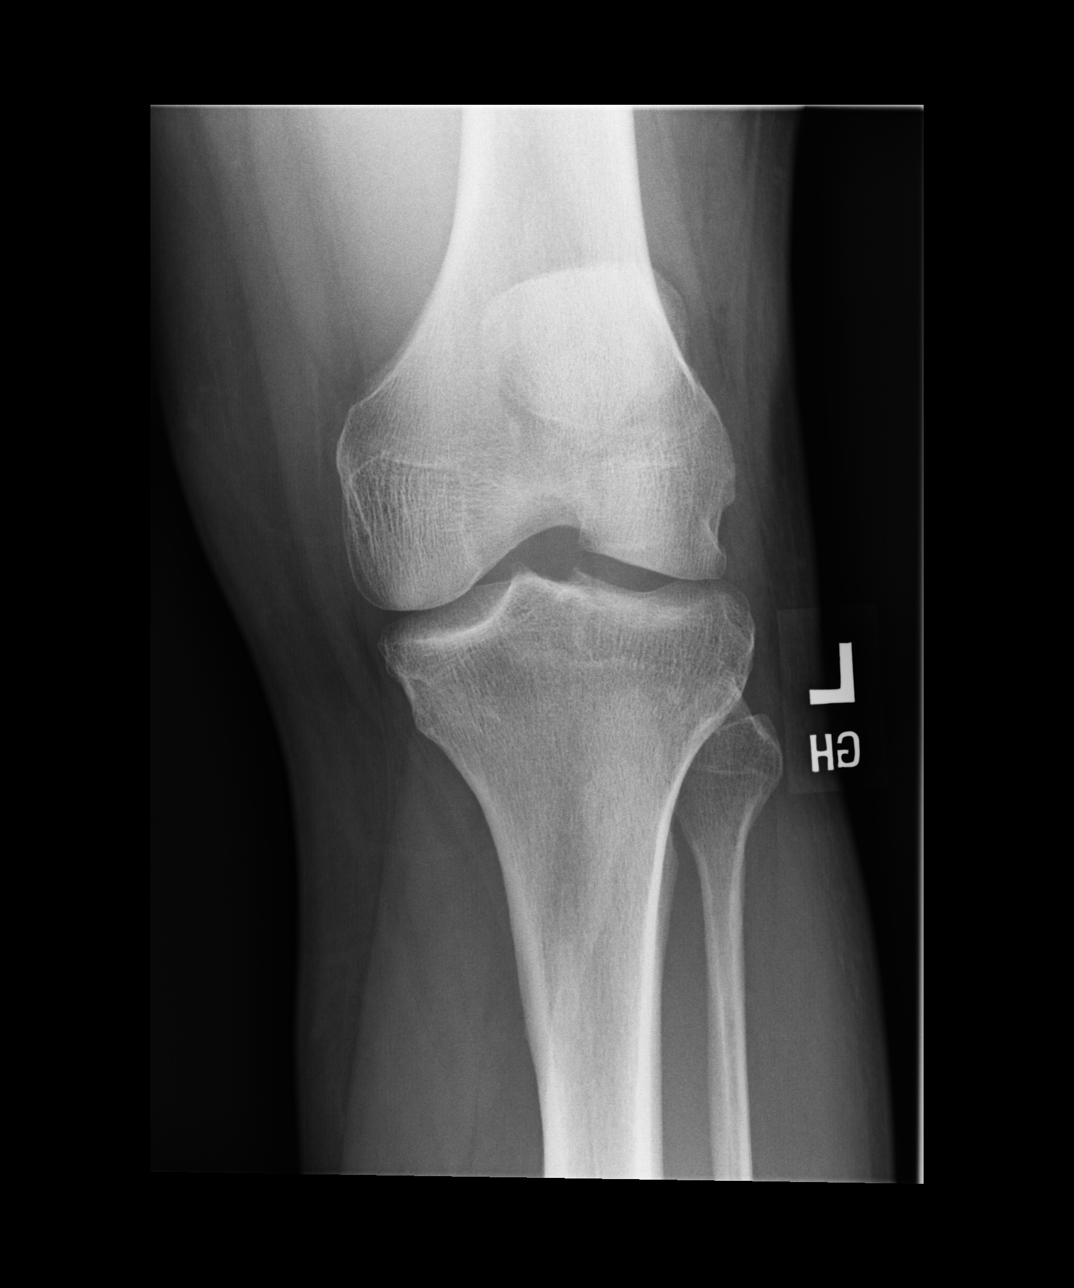

[dg knee complete 4 views left (4 of 4)]
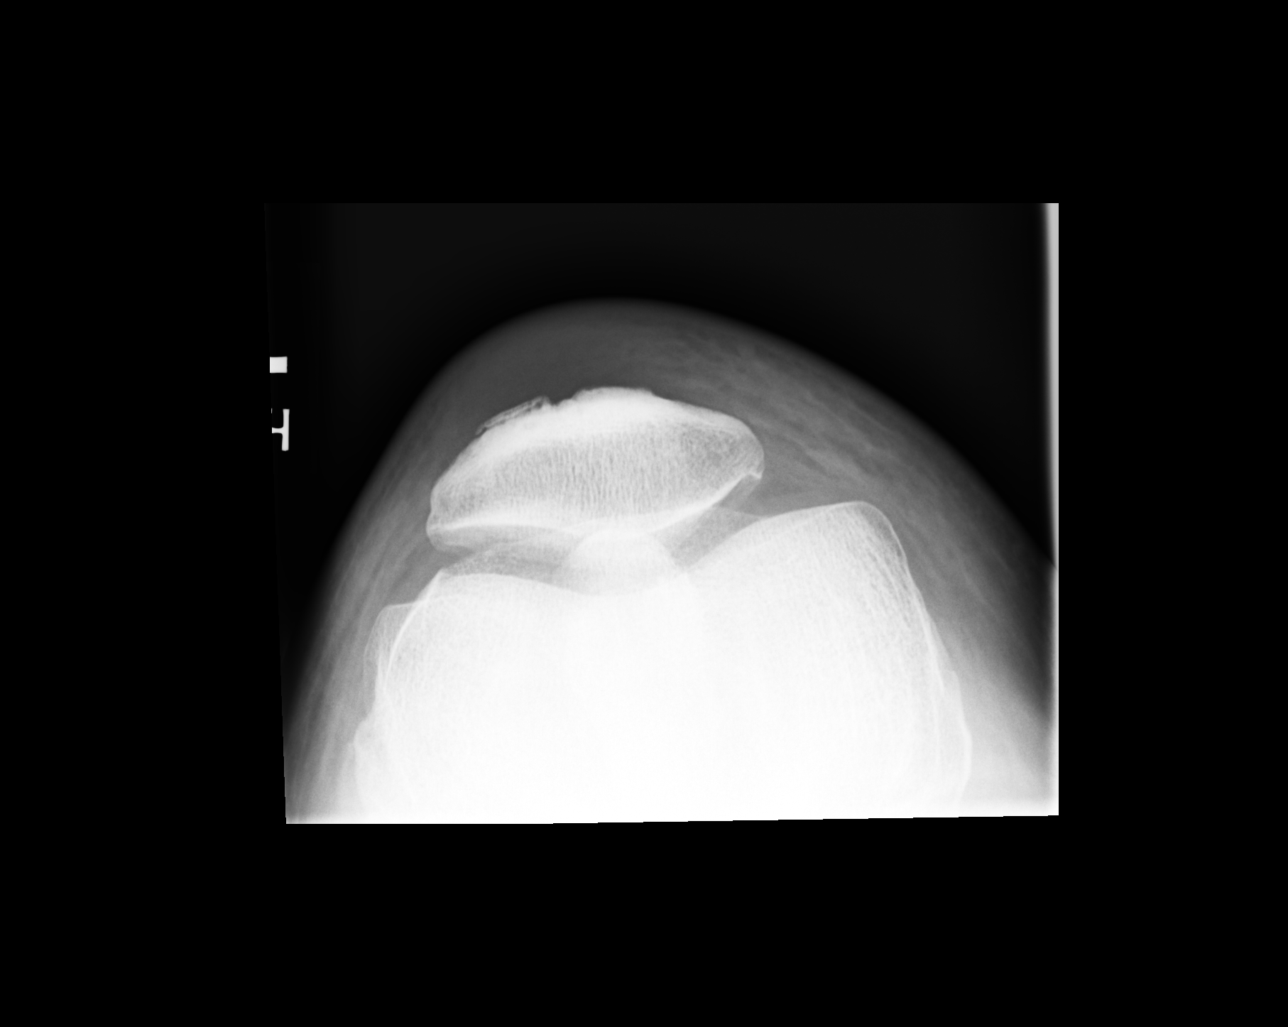

[4 of 4 positions shown; findings below may reference images not displayed]

FINDINGS: No evidence of fracture, dislocation, or joint effusion. No
significant arthropathy or other focal bone abnormality. Soft
tissues are unremarkable.
IMPRESSION: No acute osseous abnormality.

## 2021-10-17 ENCOUNTER — Other Ambulatory Visit: Payer: Self-pay | Admitting: Nurse Practitioner

## 2021-11-15 ENCOUNTER — Encounter (INDEPENDENT_AMBULATORY_CARE_PROVIDER_SITE_OTHER): Payer: BC Managed Care – PPO | Admitting: Ophthalmology

## 2021-11-15 DIAGNOSIS — B350 Tinea barbae and tinea capitis: Secondary | ICD-10-CM | POA: Diagnosis not present

## 2021-11-15 DIAGNOSIS — D045 Carcinoma in situ of skin of trunk: Secondary | ICD-10-CM | POA: Diagnosis not present

## 2021-11-15 DIAGNOSIS — L821 Other seborrheic keratosis: Secondary | ICD-10-CM | POA: Diagnosis not present

## 2021-11-15 DIAGNOSIS — B078 Other viral warts: Secondary | ICD-10-CM | POA: Diagnosis not present

## 2021-11-15 DIAGNOSIS — D225 Melanocytic nevi of trunk: Secondary | ICD-10-CM | POA: Diagnosis not present

## 2024-10-29 ENCOUNTER — Emergency Department (HOSPITAL_COMMUNITY)

## 2024-10-29 ENCOUNTER — Emergency Department (HOSPITAL_COMMUNITY)
Admission: EM | Admit: 2024-10-29 | Discharge: 2024-10-29 | Disposition: A | Discharge status: Home / Self Care | Attending: Emergency Medicine | Admitting: Emergency Medicine

## 2024-10-29 ENCOUNTER — Other Ambulatory Visit: Payer: Self-pay

## 2024-10-29 ENCOUNTER — Encounter (HOSPITAL_COMMUNITY): Payer: Self-pay

## 2024-10-29 DIAGNOSIS — S42022A Displaced fracture of shaft of left clavicle, initial encounter for closed fracture: Secondary | ICD-10-CM | POA: Diagnosis not present

## 2024-10-29 DIAGNOSIS — Y92410 Unspecified street and highway as the place of occurrence of the external cause: Secondary | ICD-10-CM | POA: Diagnosis not present

## 2024-10-29 DIAGNOSIS — Z7982 Long term (current) use of aspirin: Secondary | ICD-10-CM | POA: Diagnosis not present

## 2024-10-29 DIAGNOSIS — E119 Type 2 diabetes mellitus without complications: Secondary | ICD-10-CM | POA: Diagnosis not present

## 2024-10-29 DIAGNOSIS — R0789 Other chest pain: Secondary | ICD-10-CM | POA: Diagnosis not present

## 2024-10-29 DIAGNOSIS — S60511A Abrasion of right hand, initial encounter: Secondary | ICD-10-CM | POA: Diagnosis not present

## 2024-10-29 DIAGNOSIS — Z79899 Other long term (current) drug therapy: Secondary | ICD-10-CM | POA: Insufficient documentation

## 2024-10-29 DIAGNOSIS — M25512 Pain in left shoulder: Secondary | ICD-10-CM | POA: Diagnosis present

## 2024-10-29 DIAGNOSIS — Z7984 Long term (current) use of oral hypoglycemic drugs: Secondary | ICD-10-CM | POA: Diagnosis not present

## 2024-10-29 DIAGNOSIS — I10 Essential (primary) hypertension: Secondary | ICD-10-CM | POA: Insufficient documentation

## 2024-10-29 DIAGNOSIS — Y9355 Activity, bike riding: Secondary | ICD-10-CM | POA: Diagnosis not present

## 2024-10-29 DIAGNOSIS — S80211A Abrasion, right knee, initial encounter: Secondary | ICD-10-CM | POA: Insufficient documentation

## 2024-10-29 DIAGNOSIS — R7401 Elevation of levels of liver transaminase levels: Secondary | ICD-10-CM | POA: Insufficient documentation

## 2024-10-29 DIAGNOSIS — R1012 Left upper quadrant pain: Secondary | ICD-10-CM | POA: Insufficient documentation

## 2024-10-29 LAB — COMPREHENSIVE METABOLIC PANEL WITH GFR
ALT: 64 U/L — ABNORMAL HIGH (ref 0–44)
AST: 60 U/L — ABNORMAL HIGH (ref 15–41)
Albumin: 4 g/dL (ref 3.5–5.0)
Alkaline Phosphatase: 97 U/L (ref 38–126)
Anion gap: 13 (ref 5–15)
BUN: 11 mg/dL (ref 6–20)
CO2: 22 mmol/L (ref 22–32)
Calcium: 9.1 mg/dL (ref 8.9–10.3)
Chloride: 101 mmol/L (ref 98–111)
Creatinine, Ser: 1.1 mg/dL (ref 0.61–1.24)
GFR, Estimated: >60 mL/min (ref >60)
Glucose, Bld: 120 mg/dL — ABNORMAL HIGH (ref 70–99)
Potassium: 4.1 mmol/L (ref 3.5–5.1)
Sodium: 136 mmol/L (ref 135–145)
Total Bilirubin: 0.7 mg/dL (ref 0.0–1.2)
Total Protein: 7.6 g/dL (ref 6.5–8.1)

## 2024-10-29 LAB — CBG MONITORING, ED: Glucose-Capillary: 114 mg/dL — ABNORMAL HIGH (ref 70–99)

## 2024-10-29 LAB — I-STAT CHEM 8, ED
BUN: 14 mg/dL (ref 6–20)
Calcium, Ion: 1.11 mmol/L — ABNORMAL LOW (ref 1.15–1.40)
Chloride: 103 mmol/L (ref 98–111)
Creatinine, Ser: 1.1 mg/dL (ref 0.61–1.24)
Glucose, Bld: 116 mg/dL — ABNORMAL HIGH (ref 70–99)
HCT: 45 % (ref 39.0–52.0)
Hemoglobin: 15.3 g/dL (ref 13.0–17.0)
Potassium: 4.1 mmol/L (ref 3.5–5.1)
Sodium: 138 mmol/L (ref 135–145)
TCO2: 22 mmol/L (ref 22–32)

## 2024-10-29 LAB — CBC WITH DIFFERENTIAL/PLATELET
Abs Immature Granulocytes: 0.07 K/uL (ref 0.00–0.07)
Basophils Absolute: 0.1 K/uL (ref 0.0–0.1)
Basophils Relative: 1 %
Eosinophils Absolute: 0.1 K/uL (ref 0.0–0.5)
Eosinophils Relative: 1 %
HCT: 44.8 % (ref 39.0–52.0)
Hemoglobin: 15.1 g/dL (ref 13.0–17.0)
Immature Granulocytes: 1 %
Lymphocytes Relative: 29 %
Lymphs Abs: 2.7 K/uL (ref 0.7–4.0)
MCH: 29.4 pg (ref 26.0–34.0)
MCHC: 33.7 g/dL (ref 30.0–36.0)
MCV: 87.3 fL (ref 80.0–100.0)
Monocytes Absolute: 0.6 K/uL (ref 0.1–1.0)
Monocytes Relative: 6 %
Neutro Abs: 5.7 K/uL (ref 1.7–7.7)
Neutrophils Relative %: 62 %
Platelets: 256 K/uL (ref 150–400)
RBC: 5.13 MIL/uL (ref 4.22–5.81)
RDW: 13.2 % (ref 11.5–15.5)
WBC: 9.2 K/uL (ref 4.0–10.5)
nRBC: 0 % (ref 0.0–0.2)

## 2024-10-29 MED ORDER — IOHEXOL 350 MG/ML SOLN
75.0000 mL | Freq: Once | INTRAVENOUS | Status: AC | PRN
  Administered 2024-10-29: 75 mL via INTRAVENOUS

## 2024-10-29 MED ORDER — IBUPROFEN 400 MG PO TABS
400.0000 mg | ORAL_TABLET | Freq: Once | ORAL | Status: AC
  Administered 2024-10-29: 400 mg via ORAL
  Filled 2024-10-29: qty 1

## 2024-10-29 MED ORDER — ACETAMINOPHEN 500 MG PO TABS
1000.0000 mg | ORAL_TABLET | Freq: Once | ORAL | Status: AC
  Administered 2024-10-29: 1000 mg via ORAL
  Filled 2024-10-29: qty 2
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
## 2024-10-29 NOTE — Progress Notes (Signed)
 Orthopedic Tech Progress Note Patient Details:  John Decker 1966/05/11 [id number]  Ortho Devices Type of Ortho Device: Arm sling Ortho Device/Splint Location: LUE Ortho Device/Splint Interventions: Ordered, Application, Adjustment   Post Interventions Patient Tolerated: Fair Instructions Provided: Adjustment of device, Care of device  [name] [name] 10/29/2024, 9:12 PM
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
## 2024-10-29 NOTE — ED Notes (Signed)
Ortho tech called to apply sling.  
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
## 2024-10-29 NOTE — Discharge Instructions (Addendum)
 Avoid lifting anything with the left arm.  Take 2 Tylenol  (1000 mg) and 2 ibuprofen (400 mg) together every 6 hours as needed for pain control.  You can also apply ice to the area.  Wear the sling at all times unless you are showering.
